# Patient Record
Sex: Female | Born: 1975 | Race: Black or African American | Hispanic: No | Marital: Single | Smoking: Never smoker
Health system: Southern US, Community
[De-identification: ages and names within clinical notes are randomized; demographics above are authoritative.]

---

## 2014-12-13 ENCOUNTER — Emergency Department (HOSPITAL_BASED_OUTPATIENT_CLINIC_OR_DEPARTMENT_OTHER): Payer: BC Managed Care – PPO

## 2014-12-13 ENCOUNTER — Encounter (HOSPITAL_BASED_OUTPATIENT_CLINIC_OR_DEPARTMENT_OTHER): Payer: Self-pay | Admitting: *Deleted

## 2014-12-13 ENCOUNTER — Emergency Department (HOSPITAL_BASED_OUTPATIENT_CLINIC_OR_DEPARTMENT_OTHER)
Admission: EM | Admit: 2014-12-13 | Discharge: 2014-12-13 | Disposition: A | Discharge status: Home / Self Care | Payer: BC Managed Care – PPO | Attending: Emergency Medicine | Admitting: Emergency Medicine

## 2014-12-13 DIAGNOSIS — W1830XA Fall on same level, unspecified, initial encounter: Secondary | ICD-10-CM | POA: Diagnosis not present

## 2014-12-13 DIAGNOSIS — Y998 Other external cause status: Secondary | ICD-10-CM | POA: Diagnosis not present

## 2014-12-13 DIAGNOSIS — Y9289 Other specified places as the place of occurrence of the external cause: Secondary | ICD-10-CM | POA: Diagnosis not present

## 2014-12-13 DIAGNOSIS — S83206A Unspecified tear of unspecified meniscus, current injury, right knee, initial encounter: Secondary | ICD-10-CM | POA: Diagnosis not present

## 2014-12-13 DIAGNOSIS — Y9389 Activity, other specified: Secondary | ICD-10-CM | POA: Insufficient documentation

## 2014-12-13 DIAGNOSIS — S8991XA Unspecified injury of right lower leg, initial encounter: Secondary | ICD-10-CM | POA: Diagnosis present

## 2014-12-13 MED ORDER — IBUPROFEN 800 MG PO TABS
800.0000 mg | ORAL_TABLET | Freq: Once | ORAL | Status: AC
  Administered 2014-12-13: 800 mg via ORAL
  Filled 2014-12-13: qty 1

## 2014-12-13 MED ORDER — OXYCODONE-ACETAMINOPHEN 5-325 MG PO TABS: 1.0000 | ORAL_TABLET | Freq: Four times a day (QID) | ORAL | Status: AC | PRN

## 2014-12-13 NOTE — Discharge Instructions (Signed)
Take motrin 600 mg every 6 hrs for pain.   Take percocet for severe pain. You shouldn't drive.  Do not bear weight on the leg. Use crutches.   Follow up with ortho. You will likely need MRI.   Return to ER if you have severe pain, weakness in your leg, numbness in the leg.

## 2014-12-13 NOTE — ED Provider Notes (Signed)
CSN: [id number]     Arrival date & time 12/13/14  1511 History   First MD Initiated Contact with Patient 12/13/14 1524     Chief Complaint  Patient presents with  . Knee Injury     (Consider location/radiation/quality/duration/timing/severity/associated sxs/prior Treatment) The history is provided by the patient.  Rebecca Copeland is a 38 y.o. female here presenting with right knee pain. 4 days ago she slipped on the wooden floor and landed on right knee. She was unable to bear weight afterwards. Went to [facility] and x-rays that showed no fracture. She was given knee immobilizer and went home. Still had persistent pain and had trouble bearing weight on the right leg. Hasn't seen orthopedic doctor yet. Denies loss of consciousness or head injury or other injuries.    History reviewed. No pertinent past medical history. History reviewed. No pertinent past surgical history. No family history on file. History  Substance Use Topics  . Smoking status: Never Smoker   . Smokeless tobacco: Not on file  . Alcohol Use: No   OB History    No data available     Review of Systems  Musculoskeletal:       R knee pain  All other systems reviewed and are negative.     Allergies  Review of patient's allergies indicates no known allergies.  Home Medications   Prior to Admission medications   Not on File   BP 133/65 mmHg  Pulse 58  Temp(Src) 98.3 F (36.8 C) (Oral)  Resp 20  Ht 5\' 2"  (1.575 m)  Wt 185 lb (83.915 kg)  BMI 33.83 kg/m2  SpO2 100%  LMP 12/06/2014 Physical Exam  Constitutional: She is oriented to person, place, and time.  Uncomfortable   HENT:  Head: Normocephalic.  Eyes: Pupils are equal, round, and reactive to light.  Neck: Normal range of motion.  Cardiovascular: Normal rate.   Pulmonary/Chest: Effort normal.  Abdominal: Soft.  Musculoskeletal:  R knee swollen, mild diffuse tenderness. Dec ROM from pain. R hip and femur and tib/fib not tender. 2+  pulses. Neurovascular intact   Neurological: She is alert and oriented to person, place, and time.  Skin: Skin is warm and dry.  Psychiatric: She has a normal mood and affect. Her behavior is normal. Judgment and thought content normal.  Nursing note and vitals reviewed.   ED Course  Procedures (including critical care time) Labs Review Labs Reviewed - No data to display  Imaging Review Ct Knee Right Wo Contrast  12/13/2014   CLINICAL DATA:  Status post fall 4 days ago with a blow to the right knee. Worsening pain with limited range of motion.  EXAM: CT OF THE RIGHT KNEE WITHOUT CONTRAST  TECHNIQUE: Multidetector CT imaging of the right knee was performed according to the standard protocol. Multiplanar CT image reconstructions were also generated.  COMPARISON:  Plain films right knee 12/09/2014.  FINDINGS: A moderate to moderately large knee joint effusion is identified. No fracture is seen. As visualized by CT scan, the cruciate and collateral ligaments appear intact. There may be a tear of the posterior horn of the medial meniscus near the meniscal root. A Baker's cyst measures 5.6 cm craniocaudal by 3.0 cm AP by 1.8 cm transverse.  IMPRESSION: Negative for fracture.  Possible tear of the medial meniscus near the root of the posterior horn. Nonemergent MRI could be used for further evaluation.  Baker's cyst.   Electronically Signed   By: Drusilla Kannerhomas  Dalessio M.D.  On: 12/13/2014 16:41     EKG Interpretation None      MDM   Final diagnoses:  Knee injury, right, initial encounter   Rebecca Copeland is a 38 y.o. female here with persistent R knee pain. Will get ct to r/o tibial plateau fracture.   4:49 PM CT showed no tibial plateau fracture, but possible tear of medial meniscus.      Richardean Canalavid H Yao, MD 12/13/14 [phone]

## 2014-12-13 NOTE — ED Notes (Signed)
She slipped and fell on a wooden floor a few days ago. Injury to her right knee. She had xrays of her knee at [facility] that were negative. She cannot put weight on her leg.

## 2014-12-13 NOTE — ED Notes (Signed)
MD at bedside. 

## 2015-11-06 IMAGING — CT CT KNEE*R* W/O CM
2 of 3 series · 13 of 20 positions shown, 16 images · non-contrast
Comparison: Plain films right knee 12/09/2014.

CLINICAL DATA: Status post fall 4 days ago with a blow to the right
knee. Worsening pain with limited range of motion.

EXAM:
CT OF THE RIGHT KNEE WITHOUT CONTRAST
TECHNIQUE: Multidetector CT imaging of the right knee was performed according
to the standard protocol. Multiplanar CT image reconstructions were
also generated.

[Series 4: knee 2.0 b31s · axial · 0.46mm/px · z∈[-246,-76]mm · 12 of 101 slices shown, 15 images]
[im 8/101  soft-tissue]
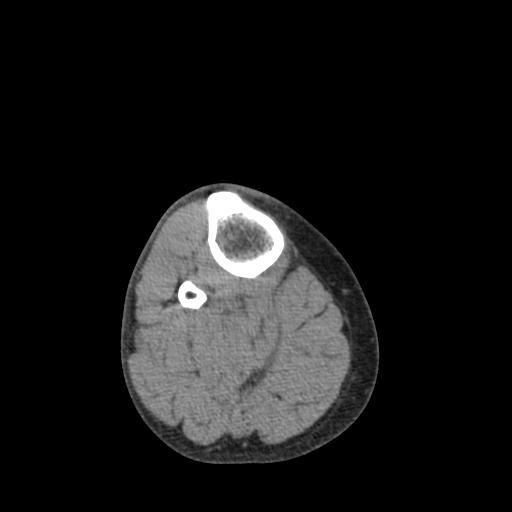
[im 8/101  bone]
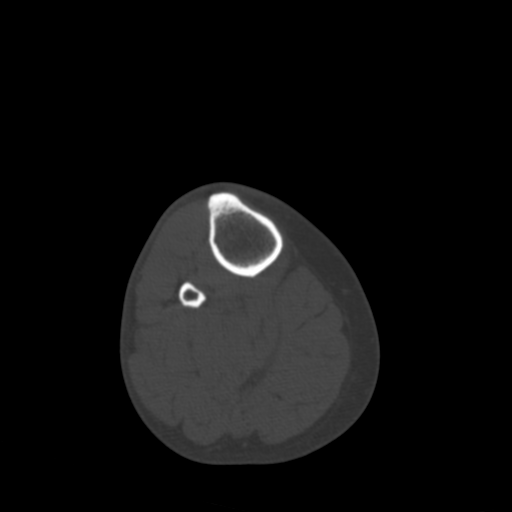
[im 16/101  bone]
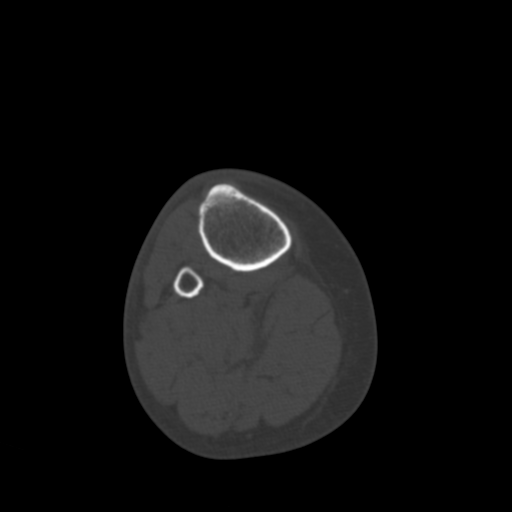
[im 24/101  bone]
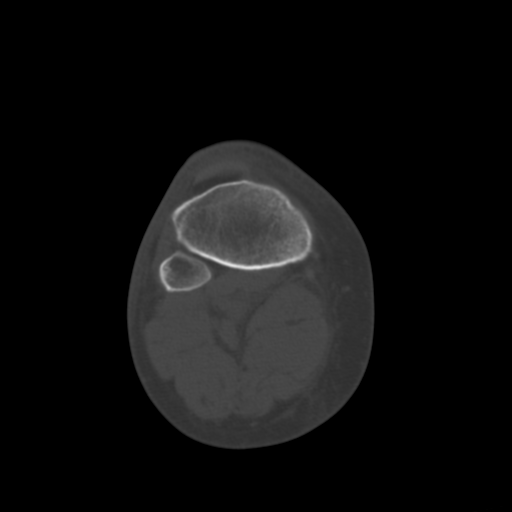
[im 31/101  bone]
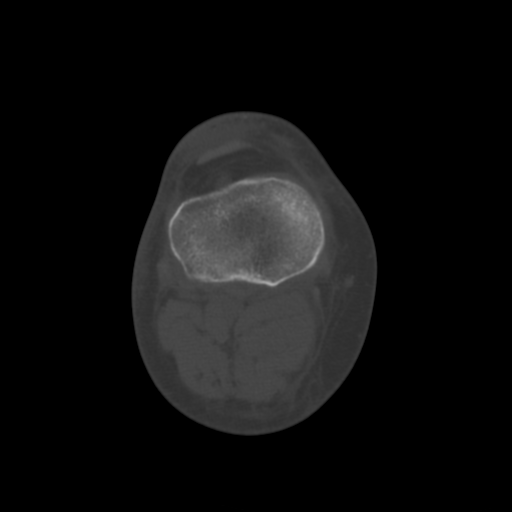
[im 39/101  soft-tissue]
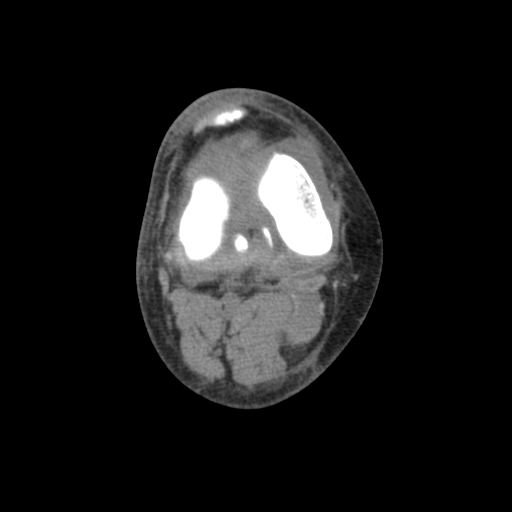
[im 39/101  bone]
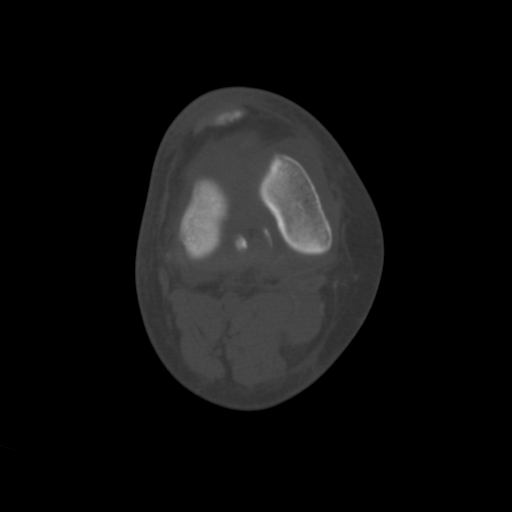
[im 47/101  bone]
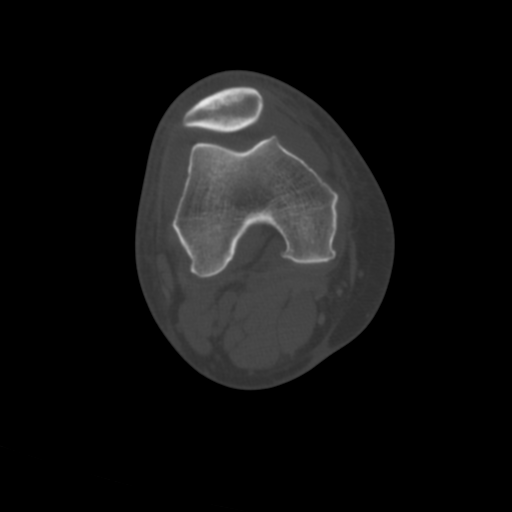
[im 54/101  bone]
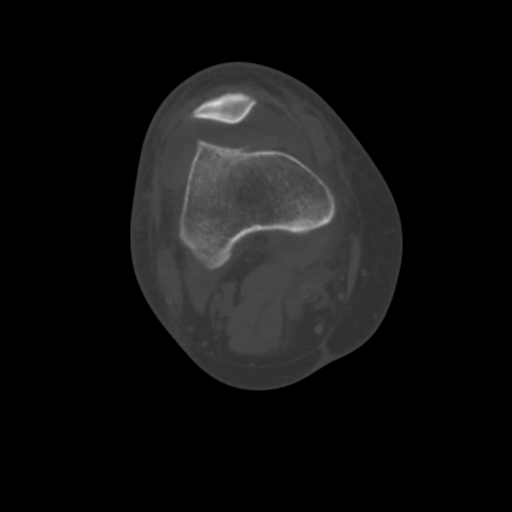
[im 62/101  bone]
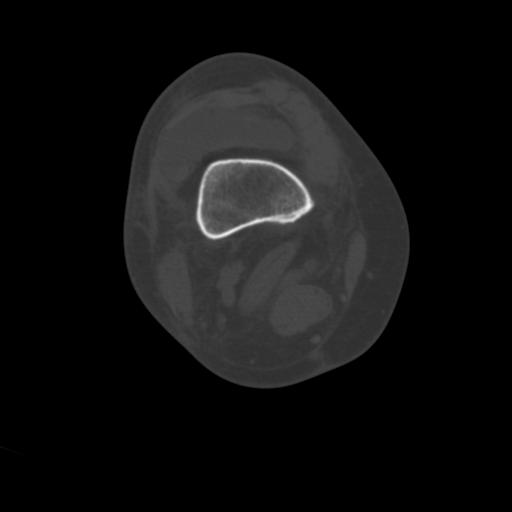
[im 70/101  soft-tissue]
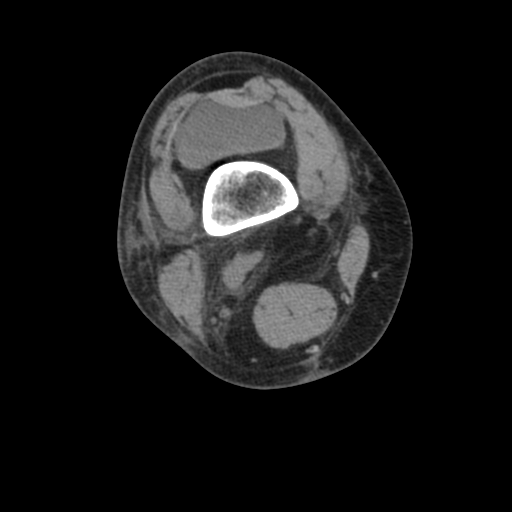
[im 70/101  bone]
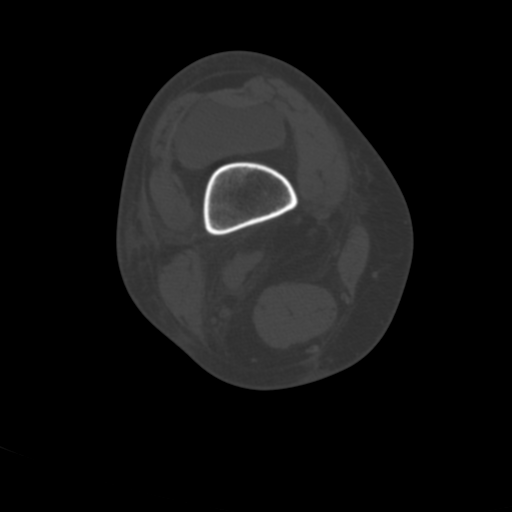
[im 77/101  bone]
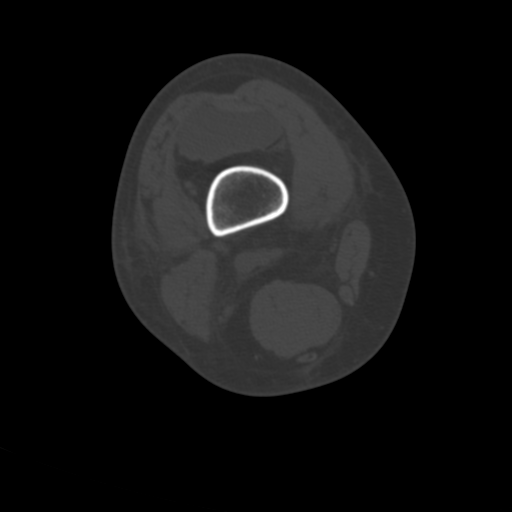
[im 85/101  bone]
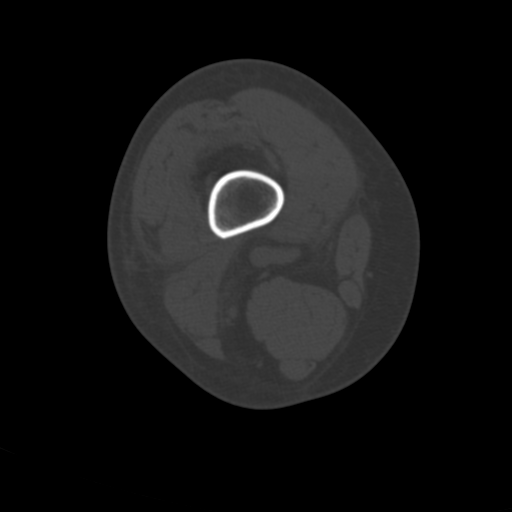
[im 93/101  bone]
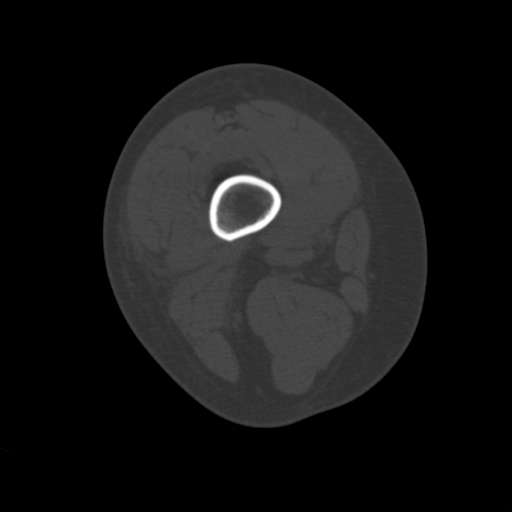

[Series 6: knee 2.0 coronal · coronal · 0.30mm/px · 1 of 75 slices shown]
[im 38/75  bone]
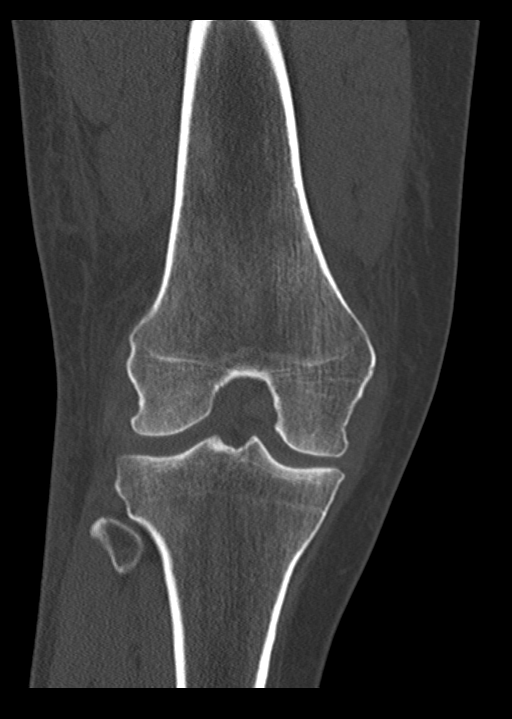

[13 of 20 positions shown; findings below may reference images not displayed]

FINDINGS: A moderate to moderately large knee joint effusion is identified. No
fracture is seen. As visualized by CT scan, the cruciate and
collateral ligaments appear intact. There may be a tear of the
posterior horn of the medial meniscus near the meniscal root. A
Baker's cyst measures 5.6 cm craniocaudal by 3.0 cm AP by 1.8 cm
transverse.
IMPRESSION: Negative for fracture.

Possible tear of the medial meniscus near the root of the posterior
horn. Nonemergent MRI could be used for further evaluation.

Baker's cyst.
